# Patient Record
Sex: Male | Born: 1990 | Race: Black or African American | Hispanic: No | Marital: Single | Smoking: Never smoker
Health system: Southern US, Community
[De-identification: ages and names within clinical notes are randomized; demographics above are authoritative.]

---

## 2014-12-15 ENCOUNTER — Emergency Department (HOSPITAL_COMMUNITY)
Admission: EM | Admit: 2014-12-15 | Discharge: 2014-12-15 | Disposition: A | Discharge status: Home / Self Care | Payer: Self-pay | Attending: Emergency Medicine | Admitting: Emergency Medicine

## 2014-12-15 ENCOUNTER — Emergency Department (HOSPITAL_COMMUNITY): Payer: Self-pay

## 2014-12-15 ENCOUNTER — Encounter (HOSPITAL_COMMUNITY): Payer: Self-pay | Admitting: Emergency Medicine

## 2014-12-15 DIAGNOSIS — R079 Chest pain, unspecified: Secondary | ICD-10-CM | POA: Insufficient documentation

## 2014-12-15 DIAGNOSIS — R0602 Shortness of breath: Secondary | ICD-10-CM | POA: Insufficient documentation

## 2014-12-15 DIAGNOSIS — R030 Elevated blood-pressure reading, without diagnosis of hypertension: Secondary | ICD-10-CM | POA: Insufficient documentation

## 2014-12-15 DIAGNOSIS — R42 Dizziness and giddiness: Secondary | ICD-10-CM | POA: Insufficient documentation

## 2014-12-15 DIAGNOSIS — IMO0001 Reserved for inherently not codable concepts without codable children: Secondary | ICD-10-CM

## 2014-12-15 DIAGNOSIS — R002 Palpitations: Secondary | ICD-10-CM | POA: Insufficient documentation

## 2014-12-15 LAB — CBC
HCT: 44.7 % (ref 39.0–52.0)
HEMOGLOBIN: 15.5 g/dL (ref 13.0–17.0)
MCH: 30.7 pg (ref 26.0–34.0)
MCHC: 34.7 g/dL (ref 30.0–36.0)
MCV: 88.5 fL (ref 78.0–100.0)
PLATELETS: 279 10*3/uL (ref 150–400)
RBC: 5.05 MIL/uL (ref 4.22–5.81)
RDW: 12.2 % (ref 11.5–15.5)
WBC: 7.7 10*3/uL (ref 4.0–10.5)

## 2014-12-15 LAB — D-DIMER, QUANTITATIVE: D-Dimer, Quant: <0.27 ug/mL-FEU (ref 0.00–0.48)

## 2014-12-15 LAB — I-STAT TROPONIN, ED: TROPONIN I, POC: 0 ng/mL (ref 0.00–0.08)

## 2014-12-15 LAB — RAPID URINE DRUG SCREEN, HOSP PERFORMED
Amphetamines: NOT DETECTED
BARBITURATES: NOT DETECTED
Benzodiazepines: NOT DETECTED
Cocaine: NOT DETECTED
Opiates: NOT DETECTED
Tetrahydrocannabinol: NOT DETECTED

## 2014-12-15 LAB — BASIC METABOLIC PANEL
Anion gap: 13 (ref 5–15)
BUN: 11 mg/dL (ref 6–20)
CHLORIDE: 100 mmol/L — AB (ref 101–111)
CO2: 26 mmol/L (ref 22–32)
Calcium: 10.1 mg/dL (ref 8.9–10.3)
Creatinine, Ser: 1.17 mg/dL (ref 0.61–1.24)
GFR calc Af Amer: >60 mL/min (ref >60)
GLUCOSE: 126 mg/dL — AB (ref 65–99)
POTASSIUM: 3.3 mmol/L — AB (ref 3.5–5.1)
Sodium: 139 mmol/L (ref 135–145)

## 2014-12-15 LAB — ETHANOL

## 2014-12-15 MED ORDER — SODIUM CHLORIDE 0.9 % IV BOLUS (SEPSIS)
1000.0000 mL | Freq: Once | INTRAVENOUS | Status: AC
  Administered 2014-12-15: 1000 mL via INTRAVENOUS

## 2014-12-15 NOTE — ED Provider Notes (Signed)
CSN: [id number]     Arrival date & time 12/15/14  0154 History  By signing my name below, I, Phillis HaggisGabriella Gaje, attest that this documentation has been prepared under the direction and in the presence of Loren Raceravid Raylee Adamec, MD. Electronically Signed: Phillis HaggisGabriella Gaje, ED Scribe. 12/15/2014. 5:48 AM.    Chief Complaint  Patient presents with  . Palpitations  . Shortness of Breath   The history is provided by the patient. No language interpreter was used.  HPI Comments: Shawna Orleansric D Renstrom Jr. is a 24 y.o. male who presents to the Emergency Department complaining of palpitations onset this evening and SOB onset 3 days ago. Pt states that the palpitation sensation woke him from sleep. He states that he does not currently feel a racing heart rate or SOB, but continues to feel a "pinching" sensation in his chest. He reports associated mild lightheadedness with sitting upright. He states that he drank one glass of Chardonnay tonight. Pt states that he was recently seen at Urgent Care and prescribed an OTC pain medication. He reports family hx of HTN and states grandmother had heart problems. He denies any HR symptoms prior to tonight. He denies use of new medications or eating irregularly. He denies hx of similar symptoms, personal hx of HTN, recent long travel, or family hx of blood clots.   History reviewed. No pertinent past medical history. History reviewed. No pertinent past surgical history. History reviewed. No pertinent family history. Social History  Substance Use Topics  . Smoking status: Never Smoker   . Smokeless tobacco: None  . Alcohol Use: Yes     Comment: occ    Review of Systems  Constitutional: Negative for fever and chills.  Respiratory: Positive for shortness of breath. Negative for cough.   Cardiovascular: Positive for chest pain and palpitations. Negative for leg swelling.  Gastrointestinal: Negative for nausea, vomiting and abdominal pain.  Musculoskeletal: Negative for back pain,  neck pain and neck stiffness.  Skin: Negative for rash and wound.  Neurological: Positive for dizziness and light-headedness. Negative for syncope, weakness, numbness and headaches.  All other systems reviewed and are negative.     Allergies  Review of patient's allergies indicates no known allergies.  Home Medications   Prior to Admission medications   Not on File   BP 167/88 mmHg  Pulse 93  Temp(Src) 98.2 F (36.8 C) (Oral)  Resp 23  Ht 5\' 9"  (1.753 m)  Wt 180 lb (81.647 kg)  BMI 26.57 kg/m2  SpO2 100% Physical Exam  Constitutional: He is oriented to person, place, and time. He appears well-developed and well-nourished. No distress.  HENT:  Head: Normocephalic and atraumatic.  Mouth/Throat: Oropharynx is clear and moist.  Eyes: EOM are normal. Pupils are equal, round, and reactive to light.  Neck: Normal range of motion. Neck supple.  Cardiovascular: Normal rate and regular rhythm.  Exam reveals no gallop and no friction rub.   No murmur heard. Pulmonary/Chest: Effort normal and breath sounds normal. No respiratory distress. He has no wheezes. He has no rales. He exhibits no tenderness.  Abdominal: Soft. Bowel sounds are normal. He exhibits no distension and no mass. There is no tenderness. There is no rebound and no guarding.  Musculoskeletal: Normal range of motion. He exhibits no edema or tenderness.  No calf swelling or tenderness.  Neurological: He is alert and oriented to person, place, and time.  Patient is alert and oriented x3 with clear, goal oriented speech. Patient has 5/5 motor in all extremities.  Sensation is intact to light touch. Bilateral finger-to-nose is normal with no signs of dysmetria. Patient has a normal gait and walks without assistance.  Skin: Skin is warm and dry. No rash noted. No erythema.  Psychiatric: He has a normal mood and affect. His behavior is normal.  Nursing note and vitals reviewed.   ED Course  Procedures (including critical  care time) DIAGNOSTIC STUDIES: Oxygen Saturation is 98% on RA, normal by my interpretation.    COORDINATION OF CARE: 2:56 AM-Discussed treatment plan which includes labs with pt at bedside and pt agreed to plan.    Labs Review Labs Reviewed  BASIC METABOLIC PANEL - Abnormal; Notable for the following:    Potassium 3.3 (*)    Chloride 100 (*)    Glucose, Bld 126 (*)    All other components within normal limits  CBC  D-DIMER, QUANTITATIVE (NOT AT [facility])  URINE RAPID DRUG SCREEN, HOSP PERFORMED  ETHANOL  I-STAT TROPOININ, ED    Imaging Review Dg Chest 2 View  12/15/2014  CLINICAL DATA:  Initial valuation for acute shortness of breath, left-sided chest pain. EXAM: CHEST  2 VIEW COMPARISON:  None. FINDINGS: The heart size and mediastinal contours are within normal limits. Both lungs are clear. The visualized skeletal structures are unremarkable. IMPRESSION: No active cardiopulmonary disease. Electronically Signed   By: Rise Mu M.D.   On: 12/15/2014 02:44   I have personally reviewed and evaluated these images and lab results as part of my medical decision-making.   EKG Interpretation   Date/Time:  Sunday December 15 2014 02:01:26 EDT Ventricular Rate:  99 PR Interval:  158 QRS Duration: 102 QT Interval:  344 QTC Calculation: 441 R Axis:   94 Text Interpretation:  Normal sinus rhythm with sinus arrhythmia Rightward  axis Incomplete right bundle branch block Borderline ECG Confirmed by  Ranae Palms  MD, Sadee Osland ([id number]) on 12/15/2014 4:46:53 AM      MDM   Final diagnoses:  Palpitations  Elevated blood pressure    I, Shanard Treto, personally performed the services described in this documentation. All medical record entries made by the scribe were at my direction and in my presence.  I have reviewed the chart and discharge instructions and agree that the record reflects my personal performance and is accurate and complete. Kenniyah Sasaki.  12/15/2014. 5:48 AM.    Patient with hypertension on presentation. This improved with no intervention. Atypical chest pain described as pinching. No risk factors for coronary artery disease. EKG without any ischemic findings. Troponin is normal. Patient also has a normal d-dimer. He is advised to follow-up with cardiology. Patient voices understanding. Understands need to return for worsening symptoms.   Loren Racer, MD 12/15/14 [phone]

## 2014-12-15 NOTE — Discharge Instructions (Signed)
°Palpitations °A palpitation is the feeling that your heartbeat is irregular or is faster than normal. It may feel like your heart is fluttering or skipping a beat. Palpitations are usually not a serious problem. However, in some cases, you may need further medical evaluation. °CAUSES  °Palpitations can be caused by: °· Smoking. °· Caffeine or other stimulants, such as diet pills or energy drinks. °· Alcohol. °· Stress and anxiety. °· Strenuous physical activity. °· Fatigue. °· Certain medicines. °· Heart disease, especially if you have a history of irregular heart rhythms (arrhythmias), such as atrial fibrillation, atrial flutter, or supraventricular tachycardia. °· An improperly working pacemaker or defibrillator. °DIAGNOSIS  °To find the cause of your palpitations, your health care provider will take your medical history and perform a physical exam. Your health care provider may also have you take a test called an ambulatory electrocardiogram (ECG). An ECG records your heartbeat patterns over a 24-hour period. You may also have other tests, such as: °· Transthoracic echocardiogram (TTE). During echocardiography, sound waves are used to evaluate how blood flows through your heart. °· Transesophageal echocardiogram (TEE). °· Cardiac monitoring. This allows your health care provider to monitor your heart rate and rhythm in real time. °· Holter monitor. This is a portable device that records your heartbeat and can help diagnose heart arrhythmias. It allows your health care provider to track your heart activity for several days, if needed. °· Stress tests by exercise or by giving medicine that makes the heart beat faster. °TREATMENT  °Treatment of palpitations depends on the cause of your symptoms and can vary greatly. Most cases of palpitations do not require any treatment other than time, relaxation, and monitoring your symptoms. Other causes, such as atrial fibrillation, atrial flutter, or supraventricular  tachycardia, usually require further treatment. °HOME CARE INSTRUCTIONS  °· Avoid: °· Caffeinated coffee, tea, soft drinks, diet pills, and energy drinks. °· Chocolate. °· Alcohol. °· Stop smoking if you smoke. °· Reduce your stress and anxiety. Things that can help you relax include: °· A method of controlling things in your body, such as your heartbeats, with your mind (biofeedback). °· Yoga. °· Meditation. °· Physical activity such as swimming, jogging, or walking. °· Get plenty of rest and sleep. °SEEK MEDICAL CARE IF:  °· You continue to have a fast or irregular heartbeat beyond 24 hours. °· Your palpitations occur more often. °SEEK IMMEDIATE MEDICAL CARE IF: °· You have chest pain or shortness of breath. °· You have a severe headache. °· You feel dizzy or you faint. °MAKE SURE YOU: °· Understand these instructions. °· Will watch your condition. °· Will get help right away if you are not doing well or get worse. °  °This information is not intended to replace advice given to you by your health care provider. Make sure you discuss any questions you have with your health care provider. °  °Document Released: 02/13/2000 Document Revised: 02/20/2013 Document Reviewed: 04/16/2011 °Elsevier Interactive Patient Education ©2016 [employer] ° °Hypertension °Hypertension, commonly called high blood pressure, is when the force of blood pumping through your arteries is too strong. Your arteries are the blood vessels that carry blood from your heart throughout your body. A blood pressure reading consists of a higher number over a lower number, such as 110/72. The higher number (systolic) is the pressure inside your arteries when your heart pumps. The lower number (diastolic) is the pressure inside your arteries when your heart relaxes. Ideally you want your blood pressure below 120/80. °Hypertension   forces your heart to work harder to pump blood. Your arteries may become narrow or stiff. Having untreated or uncontrolled  hypertension can cause heart attack, stroke, kidney disease, and other problems. °RISK FACTORS °Some risk factors for high blood pressure are controllable. Others are not.  °Risk factors you cannot control include:  °· Race. You may be at higher risk if you are African American. °· Age. Risk increases with age. °· Gender. Men are at higher risk than women before age 45 years. After age 65, women are at higher risk than men. °Risk factors you can control include: °· Not getting enough exercise or physical activity. °· Being overweight. °· Getting too much fat, sugar, calories, or salt in your diet. °· Drinking too much alcohol. °SIGNS AND SYMPTOMS °Hypertension does not usually cause signs or symptoms. Extremely high blood pressure (hypertensive crisis) may cause headache, anxiety, shortness of breath, and nosebleed. °DIAGNOSIS °To check if you have hypertension, your health care provider will measure your blood pressure while you are seated, with your arm held at the level of your heart. It should be measured at least twice using the same arm. Certain conditions can cause a difference in blood pressure between your right and left arms. A blood pressure reading that is higher than normal on one occasion does not mean that you need treatment. If it is not clear whether you have high blood pressure, you may be asked to return on a different day to have your blood pressure checked again. Or, you may be asked to monitor your blood pressure at home for 1 or more weeks. °TREATMENT °Treating high blood pressure includes making lifestyle changes and possibly taking medicine. Living a healthy lifestyle can help lower high blood pressure. You may need to change some of your habits. °Lifestyle changes may include: °· Following the DASH diet. This diet is high in fruits, vegetables, and whole grains. It is low in salt, red meat, and added sugars. °· Keep your sodium intake below 2,300 mg per day. °· Getting at least 30-45  minutes of aerobic exercise at least 4 times per week. °· Losing weight if necessary. °· Not smoking. °· Limiting alcoholic beverages. °· Learning ways to reduce stress. °Your health care provider may prescribe medicine if lifestyle changes are not enough to get your blood pressure under control, and if one of the following is true: °· You are 18-59 years of age and your systolic blood pressure is above 140. °· You are 60 years of age or older, and your systolic blood pressure is above 150. °· Your diastolic blood pressure is above 90. °· You have diabetes, and your systolic blood pressure is over 140 or your diastolic blood pressure is over 90. °· You have kidney disease and your blood pressure is above 140/90. °· You have heart disease and your blood pressure is above 140/90. °Your personal target blood pressure may vary depending on your medical conditions, your age, and other factors. °HOME CARE INSTRUCTIONS °· Have your blood pressure rechecked as directed by your health care provider.   °· Take medicines only as directed by your health care provider. Follow the directions carefully. Blood pressure medicines must be taken as prescribed. The medicine does not work as well when you skip doses. Skipping doses also puts you at risk for problems. °· Do not smoke.   °· Monitor your blood pressure at home as directed by your health care provider.  °SEEK MEDICAL CARE IF:  °· You think you are having   a reaction to medicines taken. °· You have recurrent headaches or feel dizzy. °· You have swelling in your ankles. °· You have trouble with your vision. °SEEK IMMEDIATE MEDICAL CARE IF: °· You develop a severe headache or confusion. °· You have unusual weakness, numbness, or feel faint. °· You have severe chest or abdominal pain. °· You vomit repeatedly. °· You have trouble breathing. °MAKE SURE YOU:  °· Understand these instructions. °· Will watch your condition. °· Will get help right away if you are not doing well or get  worse. °  °This information is not intended to replace advice given to you by your health care provider. Make sure you discuss any questions you have with your health care provider. °  °Document Released: 02/15/2005 Document Revised: 07/02/2014 Document Reviewed: 12/08/2012 °Elsevier Interactive Patient Education ©2016 [employer] ° ° °

## 2014-12-15 NOTE — ED Notes (Signed)
Patient here with complaint of heart palpitations and shortness of breath. States sensation woke him from sleep tonight. Denies history of the same. Endorses shortness of breath starting 3 days ago.

## 2017-02-24 IMAGING — CR DG CHEST 2V
2 series · 2 of 2 positions shown · non-contrast
Comparison: None.

CLINICAL DATA: Initial valuation for acute shortness of breath,
left-sided chest pain.

EXAM:
CHEST  2 VIEW

[chest pa]
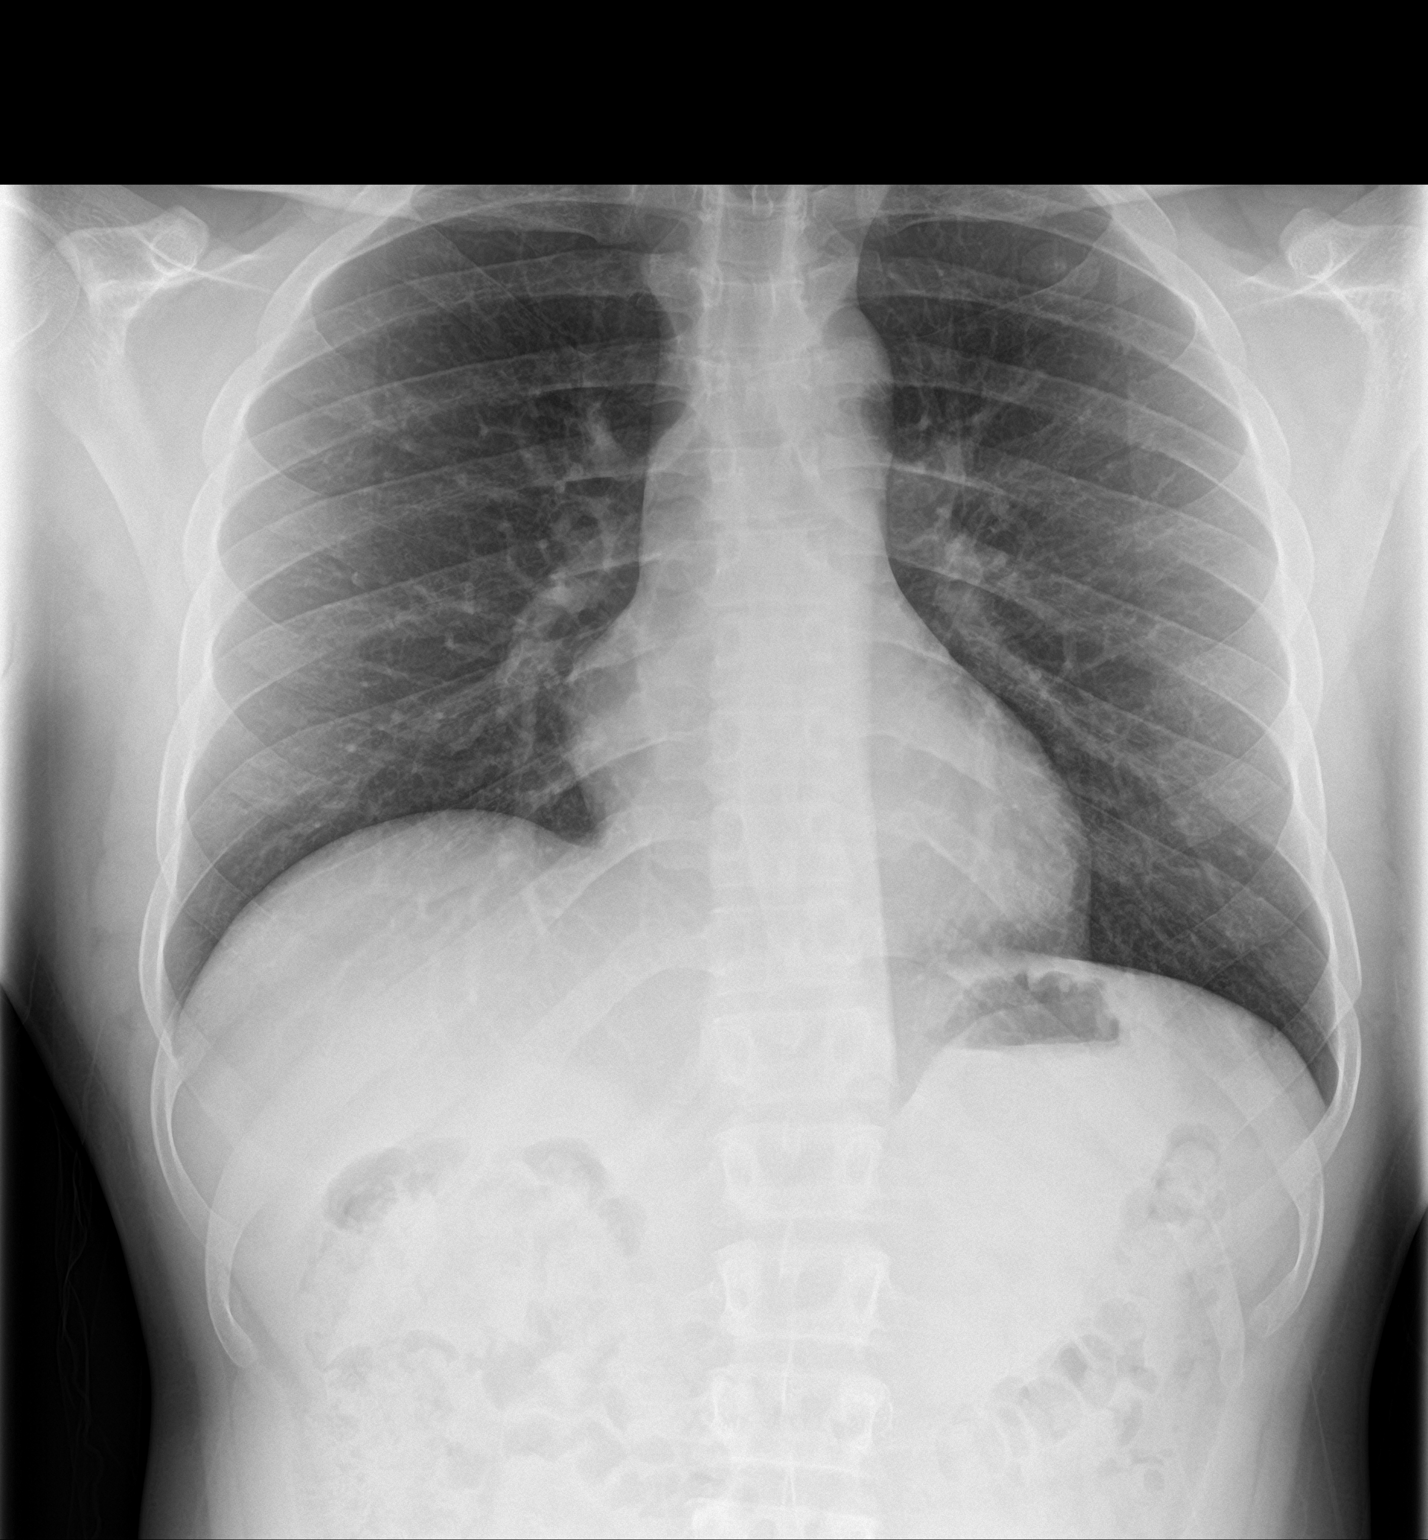

[chest lat]
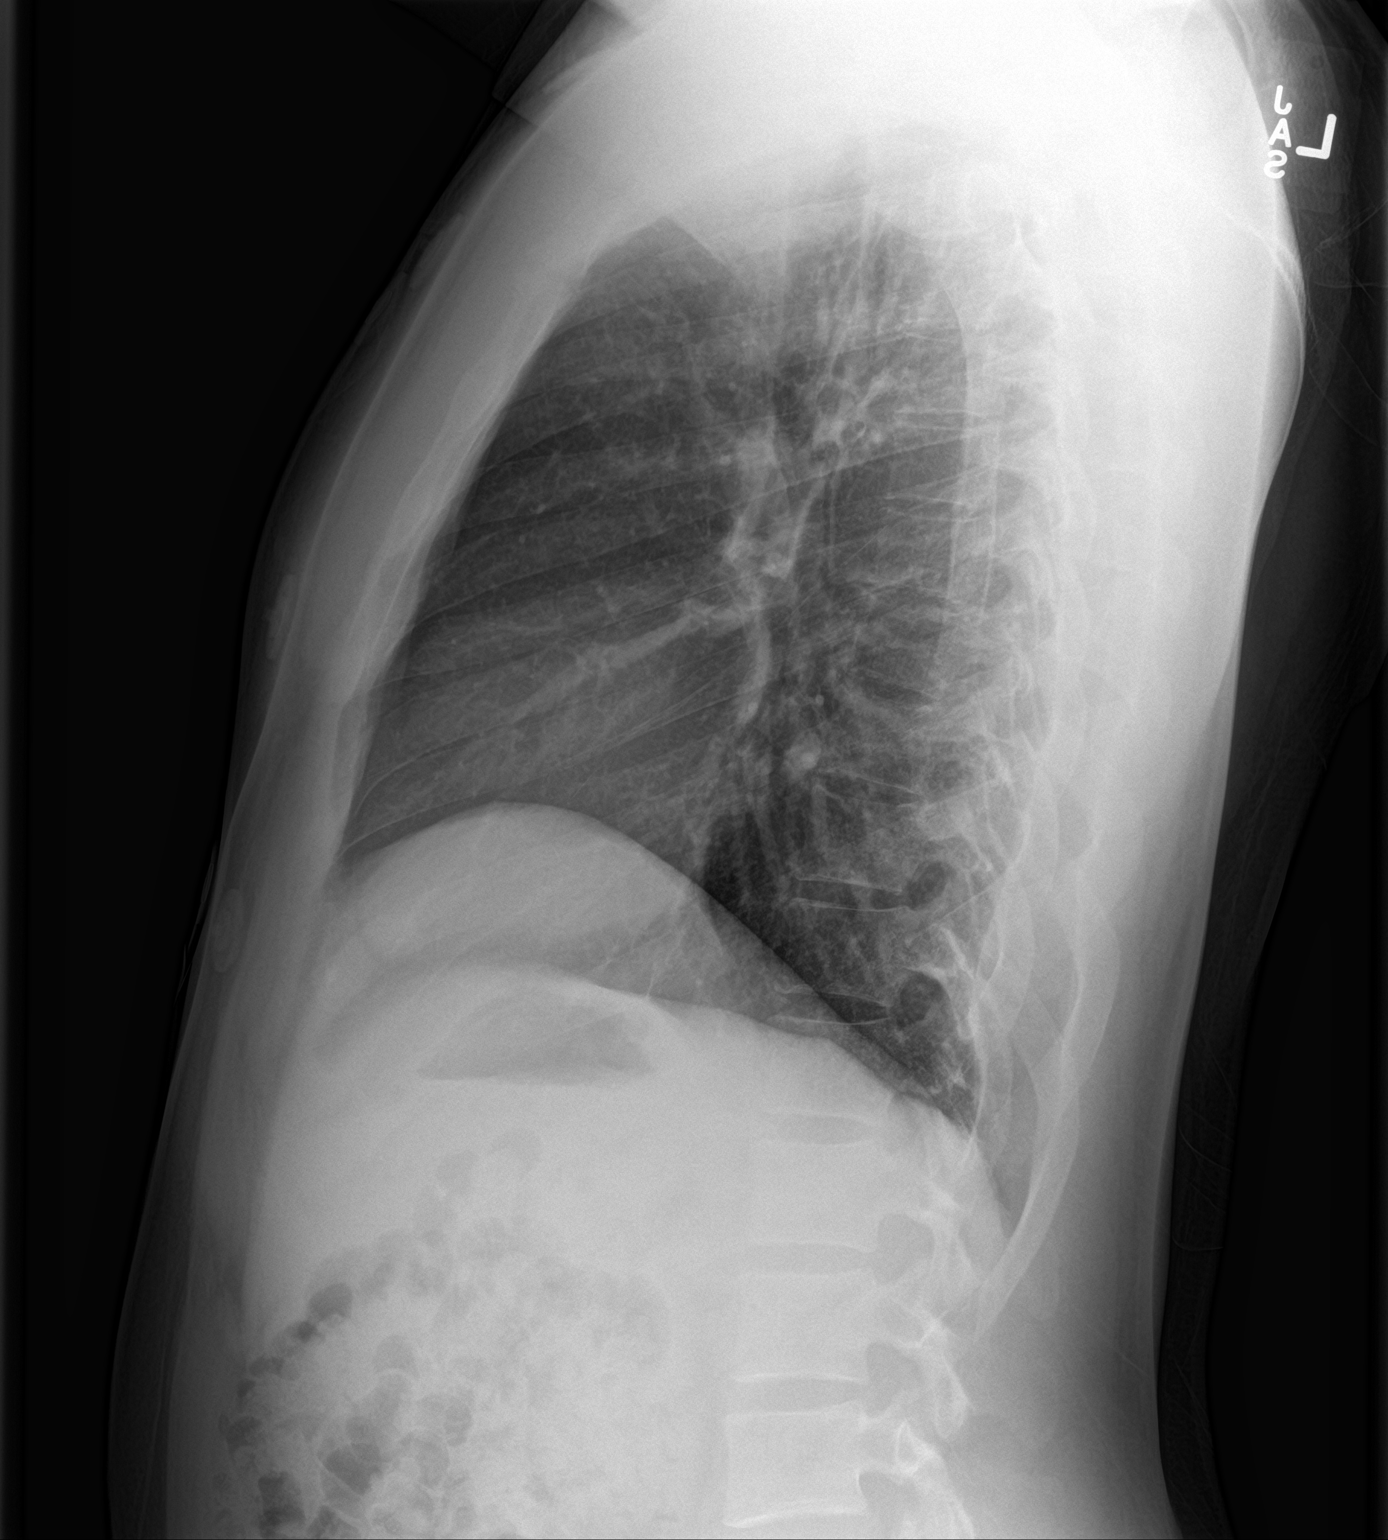

[2 of 2 positions shown; findings below may reference images not displayed]

FINDINGS: The heart size and mediastinal contours are within normal limits.
Both lungs are clear. The visualized skeletal structures are
unremarkable.
IMPRESSION: No active cardiopulmonary disease.
